# Patient Record
Sex: Male | Born: 1935 | Race: White | Hispanic: No | Marital: Married | Smoking: Former smoker
Health system: Southern US, Community
[De-identification: ages and names within clinical notes are randomized; demographics above are authoritative.]

## PROBLEM LIST (undated history)

## (undated) DIAGNOSIS — I10 Essential (primary) hypertension: Secondary | ICD-10-CM

## (undated) DIAGNOSIS — E1165 Type 2 diabetes mellitus with hyperglycemia: Secondary | ICD-10-CM

## (undated) DIAGNOSIS — E559 Vitamin D deficiency, unspecified: Secondary | ICD-10-CM

## (undated) DIAGNOSIS — E785 Hyperlipidemia, unspecified: Secondary | ICD-10-CM

## (undated) DIAGNOSIS — M1990 Unspecified osteoarthritis, unspecified site: Secondary | ICD-10-CM

## (undated) DIAGNOSIS — I2510 Atherosclerotic heart disease of native coronary artery without angina pectoris: Secondary | ICD-10-CM

## (undated) DIAGNOSIS — G4700 Insomnia, unspecified: Secondary | ICD-10-CM

## (undated) DIAGNOSIS — IMO0001 Reserved for inherently not codable concepts without codable children: Secondary | ICD-10-CM

## (undated) HISTORY — DX: Unspecified osteoarthritis, unspecified site: M19.90

## (undated) HISTORY — PX: OTHER SURGICAL HISTORY: SHX169

## (undated) HISTORY — DX: Essential (primary) hypertension: I10

## (undated) HISTORY — DX: Type 2 diabetes mellitus with hyperglycemia: E11.65

## (undated) HISTORY — DX: Vitamin D deficiency, unspecified: E55.9

## (undated) HISTORY — PX: CORONARY ARTERY BYPASS GRAFT: SHX141

## (undated) HISTORY — DX: Reserved for inherently not codable concepts without codable children: IMO0001

## (undated) HISTORY — DX: Atherosclerotic heart disease of native coronary artery without angina pectoris: I25.10

## (undated) HISTORY — DX: Hyperlipidemia, unspecified: E78.5

## (undated) HISTORY — DX: Insomnia, unspecified: G47.00

## (undated) HISTORY — PX: PTCA: SHX146

---

## 2005-04-15 ENCOUNTER — Inpatient Hospital Stay (HOSPITAL_BASED_OUTPATIENT_CLINIC_OR_DEPARTMENT_OTHER): Admission: RE | Admit: 2005-04-15 | Discharge: 2005-04-15 | Payer: Self-pay | Admitting: Interventional Cardiology

## 2005-04-18 ENCOUNTER — Inpatient Hospital Stay (HOSPITAL_COMMUNITY): Admission: RE | Admit: 2005-04-18 | Discharge: 2005-04-22 | Payer: Self-pay | Admitting: Surgery

## 2005-05-15 ENCOUNTER — Encounter (HOSPITAL_COMMUNITY): Admission: RE | Admit: 2005-05-15 | Discharge: 2005-08-13 | Payer: Self-pay | Admitting: Interventional Cardiology

## 2005-08-14 ENCOUNTER — Encounter (HOSPITAL_COMMUNITY): Admission: RE | Admit: 2005-08-14 | Discharge: 2005-11-12 | Payer: Self-pay | Admitting: Interventional Cardiology

## 2007-04-26 ENCOUNTER — Ambulatory Visit (HOSPITAL_COMMUNITY): Admission: RE | Admit: 2007-04-26 | Discharge: 2007-04-26 | Payer: Self-pay | Admitting: Orthopedic Surgery

## 2007-05-28 ENCOUNTER — Inpatient Hospital Stay (HOSPITAL_COMMUNITY): Admission: RE | Admit: 2007-05-28 | Discharge: 2007-05-30 | Payer: Self-pay | Admitting: Specialist

## 2007-09-15 IMAGING — CR DG LUMBAR SPINE 2-3V
2 series · 2 of 2 positions shown · non-contrast
Comparison: 05/25/07.

CLINICAL DATA: Localization for L3-L5 spinal stenosis.  
LUMBAR SPINE ? CHEST - 2 VIEW:

[view not recorded (1 of 2)]
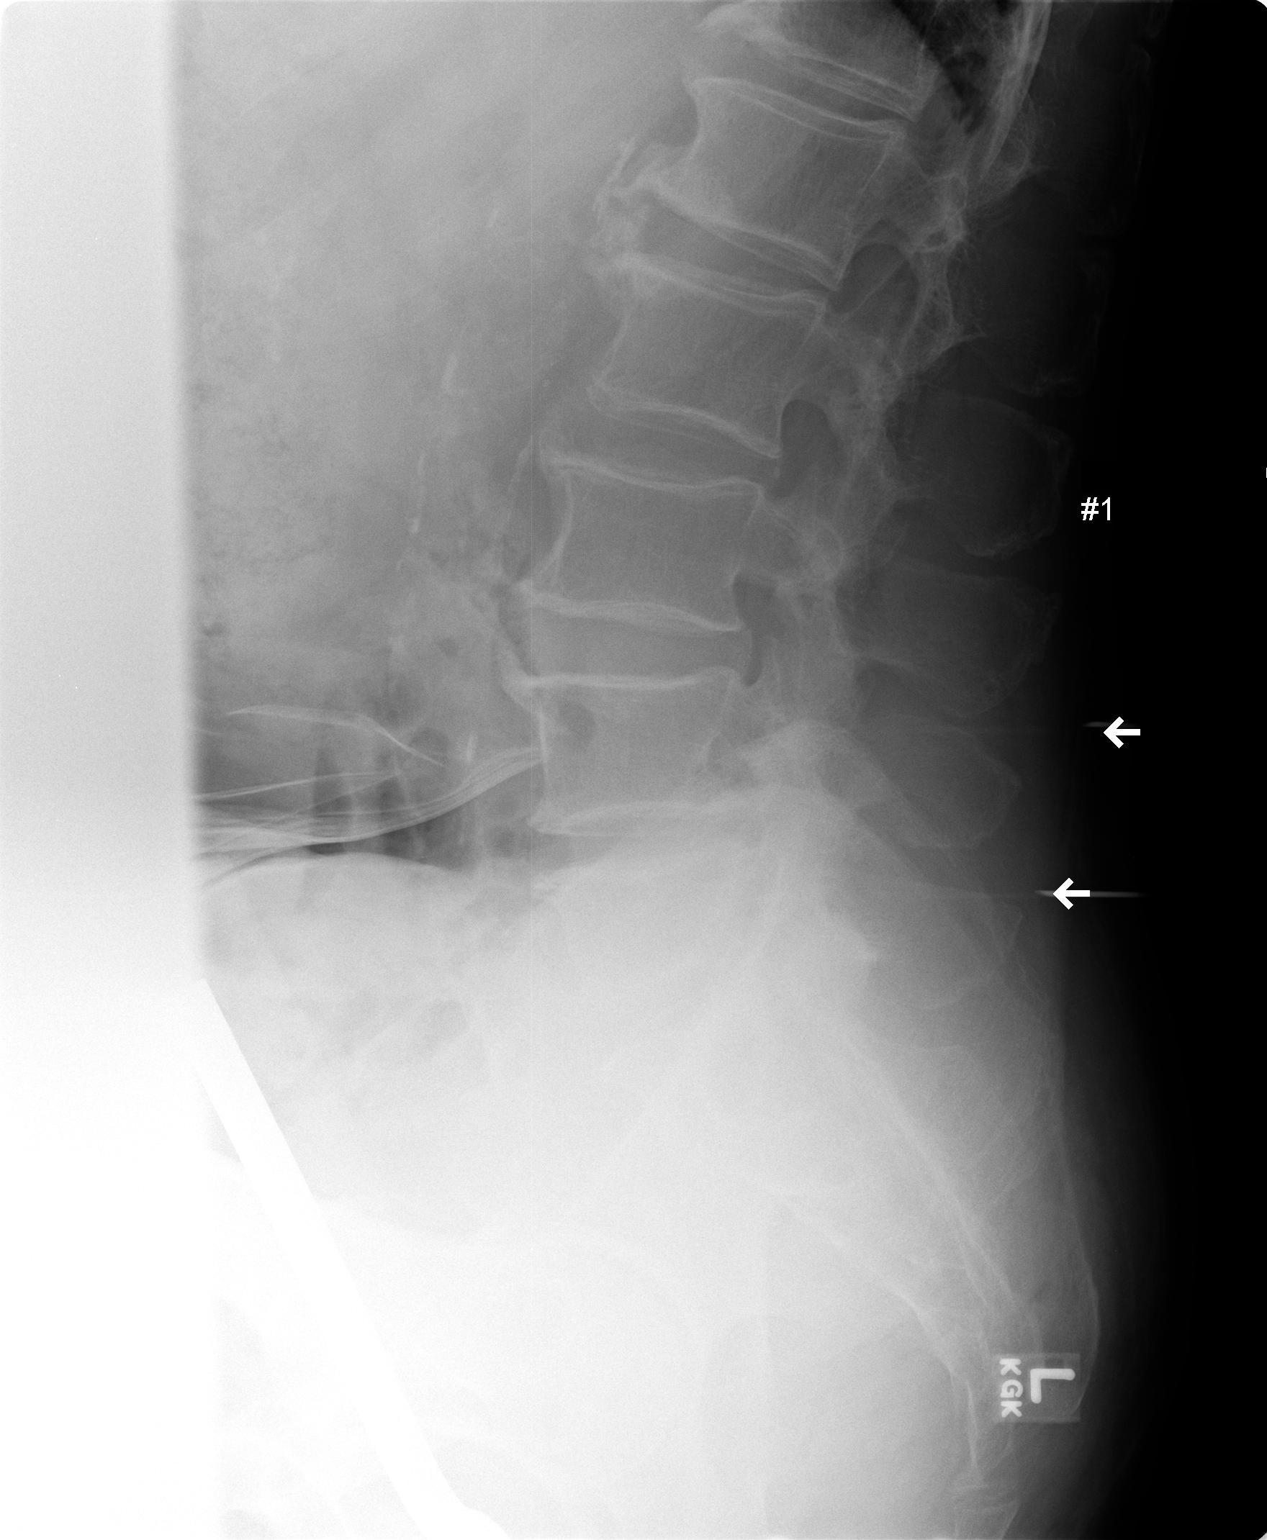

[view not recorded (2 of 2)]
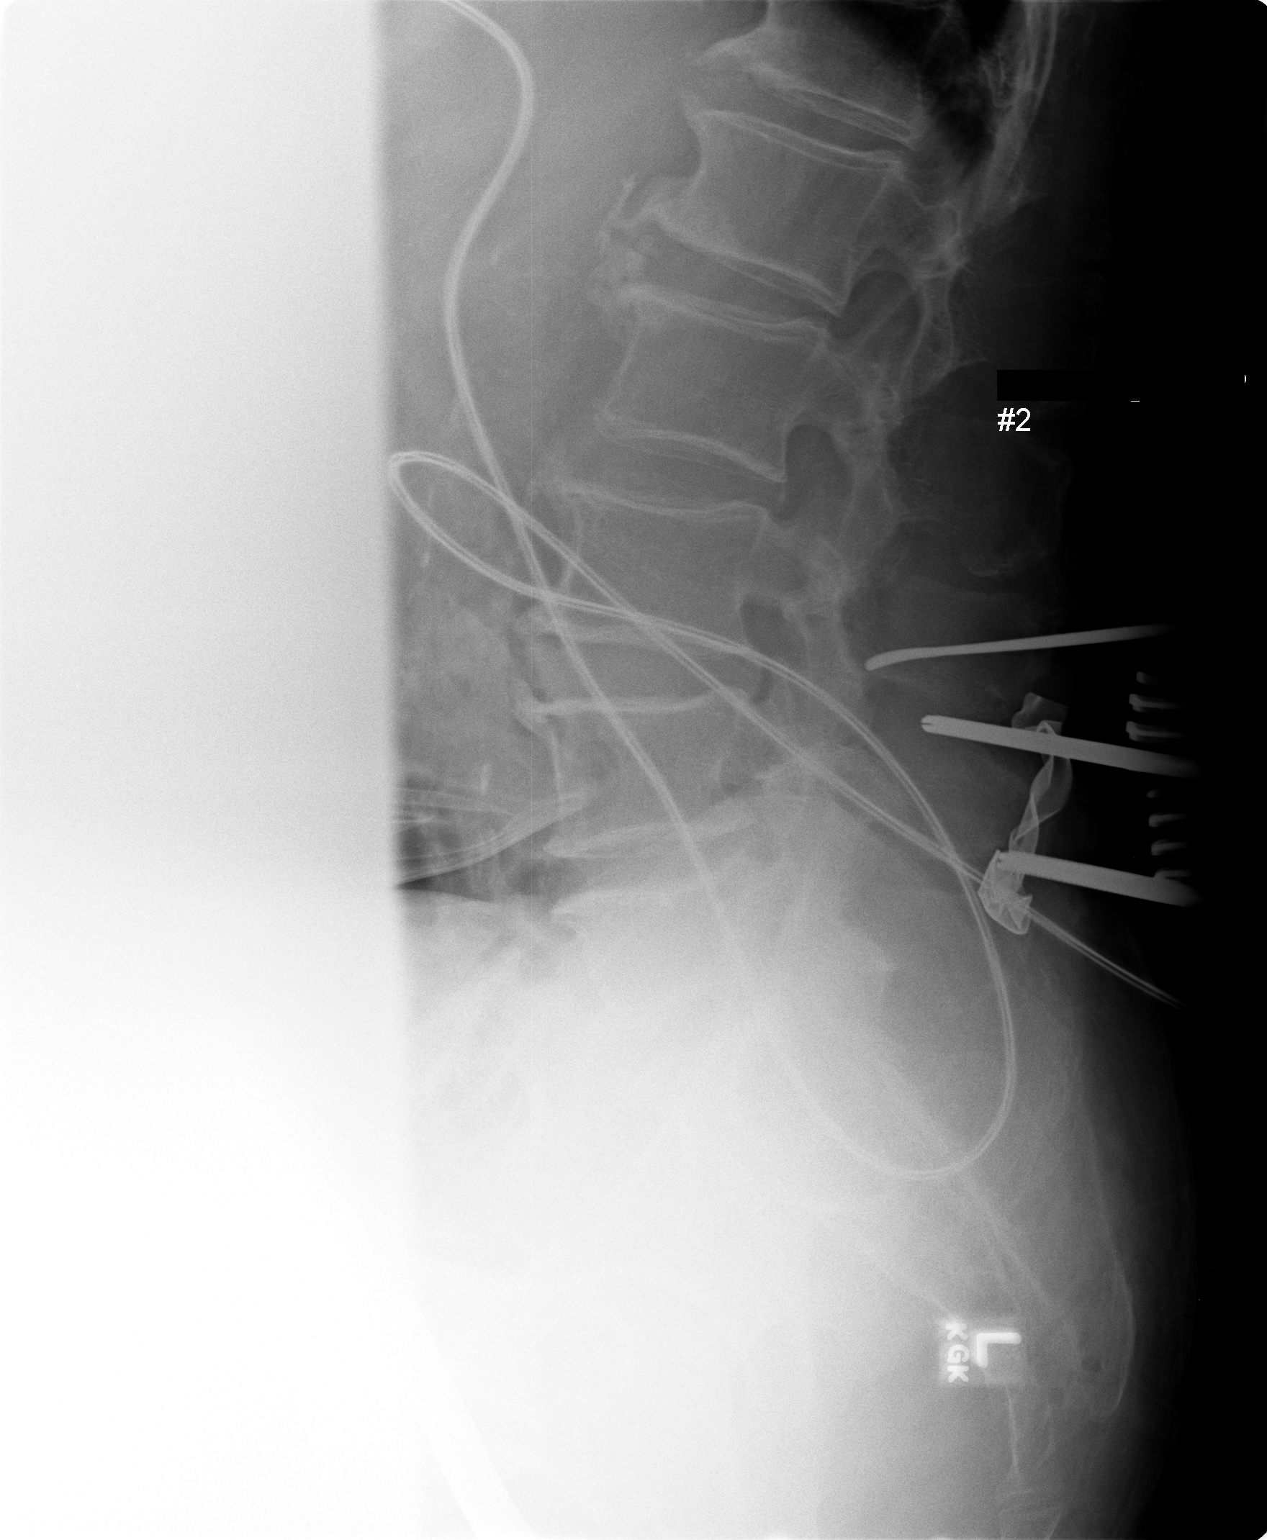

[2 of 2 positions shown; findings below may reference images not displayed]

FINDINGS: Film #1 at 7999 hours.  I am using the same vertebral body numbering that was used on the 2-view lumbar spine series dated 05/25/07. 
There are 2 metallic markers posterior to the spinous processes. One of these is positioned between the 3rd and 4th posterior spinous processes.   The second is between the 4th and 5th lumbar posterior spinous processes.
Film #2 at 3636 hours.  There is now a probe like instrument positioned posterior to the neural canal at the level of the L3-4 disc space.  A 2nd instrument is positioned between the 3rd and 4th posterior spinous processes.  A 3rd instrument is positioned between the 4th and 5th posterior spinous processes.
IMPRESSION: Lumbar localization as above.

## 2011-01-28 NOTE — Op Note (Signed)
NAMEBADEN, BETSCH NO.:  [id number]   MEDICAL RECORD NO.:  [id number]          PATIENT TYPE:  INP   LOCATION:  1609                         FACILITY:  [facility]   PHYSICIAN:  Jene Every, M.D.    DATE OF BIRTH:  May 22, 1935   DATE OF PROCEDURE:  05/27/2007  DATE OF DISCHARGE:                               OPERATIVE REPORT   PREOPERATIVE DIAGNOSIS:  Spinal stenosis L3-L4 and L4-L5.   POSTOPERATIVE DIAGNOSIS:  Spinal stenosis L3-L4 and L4-L5, dural rent.   PROCEDURE PERFORMED:  Central decompression L3-L4 and L4-L5 by  laminectomies of L3 and L4, partial of L5, repair of dural rent.   ANESTHESIA:  General.   SURGEON:  Jene Every, M.D.   ASSISTANT:  Marlowe Kays, M.D.  Roma Schanz, P.A.-C.   BRIEF HISTORY:  This is a 76 year old with neurogenic claudication  secondary to spinal stenosis, severe at L3-L4 and L4-L5.  Operative  intervention was indicated for decompression.  The risks and benefits  have been discussed including bleeding, infection, damage to  neurovascular structures, CSF leakage, epidural fibrosis, adjacent  segment disease, need for fusion in the future, anesthetic  complications, etc.   SURGICAL TECHNIQUE:  The patient was placed in a supine position. After  induction of adequate anesthesia, 1 gram of Kefzol, he was placed prone  on the [place] frame.  All bony prominences were well padded.  The  lumbar region was prepped and draped in the usual sterile fashion.  Two  18 gauge spinal needles were utilized to localize L3-L4 and L4-L5  interspace, confirmed with x-ray.  An incision was made from spinous  process of L3 to below L5.  The subcutaneous tissue was dissected.  Electrocautery was utilized to achieve hemostasis.  The dorsal lumbar  fascia was identified and divided in line with the skin incision.  The  paraspinous muscles were elevated from the lamina of L3-L4 and L4-L5.   A Leksell rongeur was utilized to remove the  spinous processes of L3 and  L4 and partially of L5.  The operating microscope was draped and brought  on the surgical field.  A 2 mm Kerrison was utilized to perform a  decompression of the L4-L5.  We decompressed laterally first at L4-L5  bilaterally.  Hemilaminotomies of the cephalad edge of L5 were  performed, as well, followed by 3 mm Kerrisons.  Next, a 2 mm Kerrison  was utilized to performed hemilaminotomies of the caudad edge of L4  followed by a complete laminectomy of L4 utilizing 2 mm Kerrison.  It  was decompressed laterally first.  The ligamentum flavum was removed  from the central portion.  There was a large osteophytic spur at L4-L5  off to the right that was removed with the Kerrison.  A dural rent was  appreciated, approximately 1.5 cm in length.  This was covered with a  neural patty and proceeded to complete the decompression.  After  removing the complete lamina of L4, we removed the ligamentum flavum  from the interspace of L3-L4, and complete laminectomy of L3 was  performed, as well,  to decompress the lateral recess to the medial  border of the pedicles.  I removed ligamentum flavum from the L3  interspace, as well.  We then decompressed the lateral borders  bilaterally to the medial border of the pedicles performed  foraminotomies of L4 and L5.  Epidural venous plexus were cauterized.  This was performed bilaterally.   After decompressing both lateral recesses and allowing a hockey stick  probe into the foramen of L3, L4, and L5 bilaterally, we then turned our  attention to the dural rent.  We used 4-0 nylon on a non-cutting needle  and placed four single sutures, repairing the dural rent.  The first  stitch was utilized at the end to hold in place some slight elevation of  the dura and a Nicholos Johns was placed to hold the nerve rootlets in place  and to keep them from being incarcerated in the repair.  Four separate  sutures were then placed for a nice watertight  closure.  Following this,  we Valsalva'd the patient and there was no evidence of CSF leakage.  Next we inspected, there was no evidence of CSF leakage or active  bleeding.  Again, excellent decompression was performed.  We obtained  confirmatory radiographs to confirm the levels.   Next, bone wax was placed on the cancellous surfaces.  We copiously  irrigated with antibiotic irrigation.  We then placed Surgicel over the  repair site and then utilized the Tisseel gel in the laminectomy defect,  covering the dural rent.  Next, the wound was copiously irrigated.  We  removed the [facility] retractors.  The paraspinous muscles were  irrigated, there was no evidence of active bleeding.  The dorsal lumbar  fascia was reapproximated with #1 Vicryl interrupted figure-of-eight  sutures, the subcutaneous tissue reapproximated with 2-0 Vicryl simple  sutures, and the skin was reapproximated with staples.  The wound was  dressed sterilely.  The patient was placed supine on the hospital bed,  extubated without difficulty, and transported to the recovery room in  satisfactory condition.   The patient tolerated the procedure well with no complications.  Blood  loss was about 100 mL.  In the recovery room, the patient was  neurovascularly intact without evidence of an orthostatic headache.      Jene Every, M.D.  Electronically Signed     JB/MEDQ  D:  05/28/2007  T:  05/29/2007  Job:  [id number]

## 2011-01-31 NOTE — H&P (Signed)
[name], [name] NO.:  [id number]   MEDICAL RECORD NO.:  [id number]         PATIENT TYPE:  LINP   LOCATION:                               FACILITY:  [facility]   PHYSICIAN:  Jene Every, M.D.    DATE OF BIRTH:  1935-02-05   DATE OF ADMISSION:  05/28/2007  DATE OF DISCHARGE:                              HISTORY & PHYSICAL   CHIEF COMPLAINT:  Lower extremity pain.   HISTORY:  [name] is a 76 year old gentleman with a longstanding  history of neurogenic claudication symptoms.  Pain is worse when he  walks.  He does get relief sitting.  This has been off and on for years.  He underwent conservative treatment without relief.  It is felt he would  benefit from a central decompression.  The risks and benefits of the  surgery were discussed with the patient.  Medical clearance was  obtained.  He does wish to proceed.   PAST MEDICAL HISTORY:  1. Coronary artery disease.  2. Hypertension.  3. Lumbar stenosis.  4. Non-insulin-dependent diabetes.  5. Gastroesophageal reflux disease.   CURRENT MEDICATIONS:  1. Amlodipine.  2. Benazepril 5/20 daily.   1. Atenolol.  2. 5/25 one q.a.m.  3. Darvocet N 100 p.r.n. pain.  4. Metformin 500 mg one p.o. q.a.m.  5. Crestor 10 mg one p.o. q.p.m.  6. Allergy and sinus over the counter p.r.n.  7. Prozac 20 mg p.r.n.  8. Aspirin 81 mg 2 tablets daily.  9. Fish oil 2400 mg two times daily.   ALLERGIES:  None.   SOCIAL HISTORY:  Unknown.   PAST SURGICAL HISTORY:  CABG in 2006; otherwise none known.   PHYSICAL EXAMINATION:  This was taken from  the preop health history  sheet.  VITAL SIGNS:  There are no vital signs.  HEENT:  Atraumatic, normocephalic.  Pupils equal, round, and reactive to  light.  EMs intact.  NECK:  Supple.  No lymphadenopathy.  CHEST:  Clear to auscultation bilaterally.  No rhonchi, wheezes or  rales.  HEART:  Regular rate and rhythm.  No murmurs, gallops or rubs.  ABDOMEN:  Soft, nontender,  nondistended.  Bowel sounds full.  SKIN:  No rashes or lesions.  EXTREMITIES:  The patient has negative straight leg raise bilaterally.  There is pain with extension of lumbar spine.  Calves are soft,  nontender.   IMPRESSION:  Severe stenosis on L3-4 and L4-5.   PLAN:  The patient admitted to [facility]  for central  decompression of the above stated levels.      Roma Schanz, P.A.      Jene Every, M.D.  Electronically Signed    CS/MEDQ  D:  06/17/2007  T:  06/17/2007  Job:  [id number]

## 2011-01-31 NOTE — Discharge Summary (Signed)
[name], [name] NO.:  [id number]   MEDICAL RECORD NO.:  [id number]          PATIENT TYPE:  INP   LOCATION:  1609                         [facility]ACILITY:  [facility]   PHYSICIAN:  Jene Every, M.D.    DATE OF BIRTH:  03-11-1935   DATE OF ADMISSION:  05/27/2007  DATE OF DISCHARGE:  05/30/2007                               DISCHARGE SUMMARY   ADMISSION DIAGNOSES:  1. Severe stenosis, L3-4, L4-5.  2. Coronary artery disease.  3. Hypertension.  4. Noninsulin-dependent diabetes.  5. Gastroesophageal reflux disease.   DISCHARGE DIAGNOSIS:  1. Severe stenosis, L3-4, L4-5, status post central decompression , L3-      4, L4-5, with large dural repair.  2. Coronary artery disease.  3. Hypertension.  4. Noninsulin-dependent diabetes.  5. Gastroesophageal reflux disease.   PROCEDURE:  The patient was taken to the OR on May 27, 2007, to  undergo a central decompression, L3-4 to L4-5.  Surgeon Dr. Tinnie Gens  pain, assistant Roma Schanz, PA-C, as well as Marlowe Kays, MD.  Anesthesia general.  Complications:  The patient did have a large dural  tear that was repaired intraoperatively.   HISTORY:  [name] is a pleasant 76 year old gentleman who has a  longstanding history of neurogenic claudication symptoms.  MRI studies  do show severe stenosis.  The patient has failed conservative treatment  and notes the pain as being disabling.  It is felt at this point that he  would benefit from central decompression.   LABORATORY:  Preoperative CBC showed a hemoglobin 15.9, hematocrit 45.6.  These were followed throughout the hospital course.  Hemoglobin and  hematocrit remained stable.  At time of discharge hemoglobin was 14.0,  hematocrit of 40.2.  He had a slight elevation in his white cell count  postoperatively to as high as 11.3; however, at the time of discharge  this had resolved to 8.4.  Routine chemistries were taken preoperatively  as well as during his  hospital stay, but these were all within normal  range except for a slightly elevated glucose, highest at 165.  At time  of discharge glucose was 107.  He did have a normal renal function.  Preoperative urinalysis was negative.  Preoperative chest x-ray showed  no acute cardiopulmonary abnormalities.  I do not see a preoperative EKG  in the chart.   HOSPITAL COURSE:  The patient was admitted and taken to the OR,  underwent the above-stated procedure.  He was then transferred to the  PACU and then to the orthopedic floor.  Postoperative precautions were  taken secondary to a large dural repair.  The patient was placed flat  for the first 24 hours.  He did fairly well with this.  Pain was  controlled.  He did note a mild headache but this is unrelated to  elevation of the bed.  He denied any nausea, no chest pain or shortness  breath.  He was passing flatus without difficulty.  Vital signs remained  stable as well as laboratory values.  We did slowly on postoperative day  #1 begin to raise  the bed.  The patient did well with this.  PAS were  used for DVT prophylaxis.  Postoperative day #2 the patient continued to  do fairly well.  We did get him out of bed.  He denied any headache or  nausea at that point.  He was doing well with physical therapy.  He did  note improvement from his surgery.  Postoperative day #3 the patient was  doing much better, decreased pain.  He had been out of bed without  difficulty.  He was voiding, good p.o. intake.  Denied any chest pain,  shortness of breath, nausea or headache.  Incision was healing.  There  was no evidence of infection.  It was felt at this point that the  patient could be discharged home.   DISPOSITION:  Patient stable to be discharged to home with home health  PT, OT and any home health needs.   WOUND CARE:  He is to change his dressing daily.  It is okay for him to  shower.   ACTIVITY:  He is to walk with assistance.  Okay for him walk  up and down  stairs utilizing a walker.  No driving for 2-4 weeks.  No sexual  activity for 4 weeks.   He is to follow up with Dr. Shelle Iron in approximately 10 days.  He is to  call if he has any increasing headache or nausea.   DISCHARGE MEDICATIONS:  All home medications as well as:   1. Vitamin C 500 mg daily.  2. Ambien 10 mg q.h.s.  3. Vicodin 5 mg one to two p.o. q.4-6h.  4. Colace 100 mg one p.o. b.i.d.   DIET:  As tolerated.   CONDITION ON DISCHARGE:  Stable.   FINAL DIAGNOSIS:  Doing well status post central decompression and  repair of large dural tear.      Roma Schanz, P.A.      Jene Every, M.D.  Electronically Signed    CS/MEDQ  D:  06/17/2007  T:  06/17/2007  Job:  [id number]

## 2011-01-31 NOTE — Cardiovascular Report (Signed)
NAME:  [name], [name] NO.:  [id number]   MEDICAL RECORD NO.:  [id number]          PATIENT TYPE:  OIB   LOCATION:  6501                         FACILITY:  [facility]   PHYSICIAN:  Lyn Records, M.D.   DATE OF BIRTH:  1935/03/28   DATE OF PROCEDURE:  04/15/2005  DATE OF DISCHARGE:                              CARDIAC CATHETERIZATION   INDICATION FOR STUDY:  [name] is 6 and has a history of prior LAD and  right coronary artery percutaneous intervention greater than 12 years ago  during an acute infarction/acute coronary syndrome.  He is a very stoic  individual who has been relatively asymptomatic.  Recent Cardiolite study  suggested lateral wall ischemia.  Despite absence of symptoms, coronary  angiography is being performed to define the coronary anatomy.   PROCEDURE PERFORMED:  1.  Left heart catheterization.  2.  Selective coronary angiography.  3.  Left ventriculography.   DESCRIPTION:  After informed consent, a 4-French sheath was placed in the  right femoral artery using the modified Seldinger technique.  A 4-French A2  multipurpose catheter was used hemodynamic recordings, left ventriculography  by hand injection, and selective right coronary angiography.  A #4, 4-French  left Judkins catheter was used left coronary angiography.  The patient  tolerated procedure without complication.   RESULTS:  1.  Hemodynamic data:      1.  Aortic pressure was 154/72 mmHg      2.  Left ventricular pressure 147/11 mmHg.  2.  Left ventriculography:  The left ventricle is faintly opacified.      Overall LV function is normal.  The ejection fraction is estimated to be 50%.  No obvious mitral  regurgitation noted.  The Cardiolite study confirmed normal LV function with  a calculated EF of 54%.  1.  Coronary angiography:      1.  Left main coronary:  Heavily calcified.  Distal 70-80% left main          eccentric stenosis.      2.  Left anterior descending coronary:   Ostial 80% narrowing.  Diffuse          disease throughout the proximal, mid and distal LAD.  No high-grade          obstruction is seen.  Heavy calcification is noted.      3.  Circumflex artery:  Proximal 90% stenosis of moderate branching          obtuse marginal is noted.      4.  Right coronary:  Heavy calcification proximal to distal, a lot of          tortuosity, multiple 50% stenoses proximal and mid, distal 80%          stenosis before large PDA and LV branches.   CONCLUSIONS:  1.  Severe three-vessel coronary artery disease, including distal left main      stenosis as outlined above.  2.  Overall normal left ventricular function.  The patient has prior      inferior infarction and also anteroapical infarction.  No  significant      regional wall motion abnormalities are noted.   PLAN:  Coronary artery bypass grafting CVTS consult this week.  Hopefully,  surgery can be done within the next seven days.      Lyn Records, M.D.  Electronically Signed     HWS/MEDQ  D:  04/15/2005  T:  04/15/2005  Job:  [id number]   cc:   Candyce Churn, M.D.  [address] [place]  [place] [address]  Fax: [phone]   CVTS Office

## 2011-01-31 NOTE — Discharge Summary (Signed)
NAMESHENANDOAH, VANDERGRIFF NO.:  [id number]   MEDICAL RECORD NO.:  [id number]          PATIENT TYPE:  INP   LOCATION:  2017                         FACILITY:  [facility]   PHYSICIAN:  Evelene Croon, M.D.     DATE OF BIRTH:  14-Feb-1935   DATE OF ADMISSION:  04/18/2005  DATE OF DISCHARGE:                                 DISCHARGE SUMMARY   ADMISSION DIAGNOSIS:  Coronary artery disease.   PAST MEDICAL HISTORY AND DISCHARGE DIAGNOSES:  1.  Coronary artery disease, status post myocardial infarction, status post      percutaneous transluminal coronary angioplasty of the right coronary      artery and left anterior descending in 1985 by Dr. Clarene Duke, status post      coronary artery bypass grafting x4.  2.  Hypercholesterolemia.  3.  Hypertension.  4.  Type 2 diabetes.  5.  Allergic rhinitis.  6.  Erectile dysfunction.  7.  History of skin cancers.  8.  Status post fracture of the left ankle.  9.  Postoperative anemia, resolved.  10. Postoperative hypokalemia, resolved.   ALLERGIES:  No known drug allergies.   BRIEF HISTORY:  The patient  is a 76 year old Caucasian male with a history  of coronary artery disease, status post  PTCA of the RCA and LAD in 1985 by  Dr. Clarene Duke.  He had a stress test in 2002 that showed no evidence of  ischemia and was scheduled for an exercise treadmill test on March 19, 2005 as  a surveillance to rule out progression of disease.  The patient states that  he had been asymptomatic and denied any chest pain or shortness of breath.  This stress test was indeterminate for ischemia due to development of  ventricular bigeminy with stress.  He had no exercise-induced angina and  failed to achieve a target heart rate.  He subsequently underwent an  adenosine Cardiolite study which showed evidence of prior inferior MI and  lateral ischemia with an ejection fraction of 54%.  The patient then  underwent elective cardiac catheterization on April 15, 2005 which  showed  heavily calcified left main disease and three-vessel coronary artery  disease.  Secondary to this information, the patient was referred to the  CVTS service regarding surgical revascularization.  Dr. Laneta Simmers evaluated the  patient in the office on April 15, 2005 and it was his opinion that the  patient should proceed with elective coronary artery bypass graft surgery.   HOSPITAL COURSE:  The patient was admitted and taken to the OR on April 18, 2005 for coronary artery bypass grafting x4.  The left internal mammary  artery was grafted to the LAD, saphenous vein graft was grafted in sequence  to the intermediate and to the OM, and saphenous vein was grafted to the PD.  Endoscopic vessel harvesting was performed on the left side.  The patient  tolerated the procedure well and was hemodynamically stable immediately  postoperatively.  The patient was transferred from the OR to the SICU in  stable condition.  The patient was extubated without complication  and woke  up from anesthesia neurologically intact.   The patient's postoperative course has progressed as expected.  On  postoperative day 1, he was afebrile and his vital signs are stable.  He was  maintained in normal sinus rhythm.  The patient's only complaint was a mild  soreness.  All invasive lines and chest tubes were discontinued in a routine  manner and all drains were weaned accordingly.  This was done without  difficulty.  The patient has been some volume-overloaded postoperatively and  has been diuresed accordingly.  He will continue to require a short course  of diuresis after discharge.   The patient's diabetes mellitus has been controlled postoperatively with  Lantus insulin initially and then his Metformin was restarted on  postoperative day 2.  The patient has tolerated this transition well and his  CBGs are currently within an acceptable range on this regimen.  The Lantus  insulin was successfully  discontinued.   On postoperative day 2, the patient began cardiac rehab and has tolerated  this quite well.  He is ambulating well with minimal assistance and  continues to improve.  Also on postoperative day 2, the patient was noted to  be anemic.  This has been stable and is resolving.   On postoperative day 3, the patient's only complaint is of mild nausea.  His  bowel function has returned and he is ambulating well.  He is afebrile with  stable vital signs and maintaining normal sinus rhythm.   The patient's only issue at this time is a mild hypertension noted late in  the evenings and the early a.m.  The patient was started on low-dose ACE  inhibitor at this time.  The patient is in stable condition at this time and  as long as he continues to progress in the current manner, he should be  ready for discharge within the next 1-2 days pending morning round  reevaluation.   PHYSICAL EXAMINATION:  CARDIAC:  Regular rate and rhythm.  LUNGS:  The lungs revealed decreased breath sounds in the bases.  ABDOMEN:  The abdomen is benign.  The incisions are clean, dry and intact.  EXTREMITIES:  There is trace edema present in the bilateral lower  extremities.   LABORATORIES:  BMP on April 21, 2005:  Sodium 138, potassium 3.5, BUN 16,  creatinine 0.8, glucose 108.  CBC on April 20, 2005:  White count 9.3,  hemoglobin 10.6, hematocrit 30, platelets 121,000.   CONDITION ON DISCHARGE:  Improved.   INSTRUCTIONS:   MEDICATIONS:  1.  Aspirin 325 mg daily.  2.  Toprol-XL 25 mg daily.  3.  Altace 2.5 mg daily.  4.  Crestor 20 mg daily.  5.  Protonix 40 mg daily.  6.  Lasix 40 mg daily x5 days.  7.  K-Dur 20 mEq daily x5 days.  8.  Glucophage 500 mg daily.  9.  Tylox one to two q.4-6 h. p.r.n. pain.   ACTIVITY:  No driving, no lifting more than 10 pounds and the patient should  continue daily breathing and walking exercises.  DIET:  Low-salt, low-fat, and carbohydrate-modified,  medium-calorie for his  diabetes.   WOUND CARE:  The patient may shower daily and clean his incisions with soap  and water.  If wound problems arise, the patient should contact the CVTS  office at [phone].   FOLLOWUP:  1.  Followup appointment with Dr. Katrinka Blazing; the patient should contact his      office for an  appointment 2 weeks after discharge.  A chest x-ray will      be taken at that time which the patient  will be instructed to bring      with him to the followup appointment with Dr.  Laneta Simmers.  2.  Dr. Laneta Simmers, May 27, 2005 at 12 p.m.  The patient will be      instructed to contact the CVTS if problems arise before the date of that      appointment.      Pecola Leisure, [place]      Evelene Croon, M.D.  Electronically Signed    AY/MEDQ  D:  04/21/2005  T:  04/22/2005  Job:  [id number]   cc:   Lyn Records, M.D.  Fax: [phone]

## 2011-01-31 NOTE — Op Note (Signed)
[name], CRAVER NO.:  [id number]   MEDICAL RECORD NO.:  [id number]          PATIENT TYPE:  INP   LOCATION:  2301                         FACILITY:  [facility]   PHYSICIAN:  Evelene Croon, M.D.     DATE OF BIRTH:  [birth year]   DATE OF PROCEDURE:  04/18/2005  DATE OF DISCHARGE:                                 OPERATIVE REPORT   PREOPERATIVE DIAGNOSIS:  Left main and severe three-vessel coronary artery  disease.   POSTOPERATIVE DIAGNOSIS:  Left main and severe three-vessel coronary artery  disease.   OPERATIVE PROCEDURE:  Median sternotomy, extracorporeal circulation,  coronary artery bypass graft surgery x4 using a left internal mammary artery  graft to the left anterior descending coronary artery, with a sequential  saphenous vein graft to the intermediate coronary artery and the obtuse  marginal branch of the left circumflex coronary artery, and a saphenous vein  graft to the posterior descending coronary artery.  Endoscopic vein  harvesting from the right leg.   ATTENDING SURGEON:  Evelene Croon, M.D.   ASSISTANT:  Salvatore Decent. Cornelius Moras, M.D.   SECOND ASSISTANT:  Coral Ceo, P.A.   ANESTHESIA:  General endotracheal.   CLINICAL HISTORY:  This patient is a 76 year old gentleman with a history of  coronary artery disease, status post PTCA of the right coronary artery and  LAD in 1985.  His last stress test in 2002 showed no evidence of ischemia.  He underwent a surveillance exercise treadmill test in July 2006, although  he has been asymptomatic.  The stress test was indeterminate due to  ventricular bigeminy with stress.  He had no exercise-induced angina and  failed to achieve a target heart rate.  He subsequently had an adenosine  Cardiolite study, which showed evidence of prior inferior MI and lateral  ischemia with an ejection fraction of 54%.  Cardiac catheterization on  April 15, 2005, showed a heavily calcified left main coronary artery with  distal  70-80% left main stenosis.  This area was somewhat hazy.  The LAD had  an 80% ostial narrowing.  The left circumflex had proximal 90% stenosis of a  marginal branch.  The right coronary artery was heavily calcified with  multiple 50% proximal and midstenoses and 80% distal stenosis before a large  posterior descending branch.  Ejection fraction was about 50% with no mitral  regurgitation.  After review of the angiogram and examination of the  patient, it was felt that coronary artery bypass graft surgery was the best  treatment to prevent further ischemia and infarction.  I discussed the  operative procedure with the patient and his wife, including the  alternatives, benefits, and risks, including bleeding, blood transfusion,  infection, stroke, myocardial infarction, graft failure, and death.  He  understood and agreed to proceed.   OPERATIVE PROCEDURE:  The patient was taken to the operating room and placed  on the table in the supine position.  After induction of general  endotracheal anesthesia, a Foley catheter was placed in the bladder using  sterile technique.  Then the chest, abdomen and both lower  extremities were  prepped and draped in the usual sterile manner.  The chest was entered  through a median sternotomy incision and the pericardium opened in the  midline.  Examination of the heart showed good ventricular contractility.  The ascending aorta had no palpable plaques in it.   Then the left internal mammary artery was harvested from the chest wall as a  pedicle graft.  This was a medium-caliber vessel with excellent blood flow  through it.  At the same time a segment of greater saphenous vein was  harvested from the right leg using endoscopic vein harvest technique.  This  vein was of medium size and good quality.   Then the patient was heparinized and when an adequate activated clotting  time was achieved, the distal ascending aorta was cannulated using a 20  French  aortic cannula for arterial inflow.  Venous outflow was achieved  using a two-stage venous cannula through the right atrial appendage.  An  antegrade cardioplegia and vent cannula was inserted in the aortic root.   The patient was placed on cardiopulmonary bypass and the distal coronary  arteries identified.  The LAD was a large, graftable vessel.  The  intermediate vessel was small but graftable.  The obtuse marginal was a  medium-sized, graftable vessel.  The posterior descending was also a medium-  sized, graftable vessel.  It had some diffuse disease in it.   Then the aorta was crossclamped and 500 mL of cold blood antegrade  cardioplegia was administered in the aortic root with quick arrest of the  heart.  Systemic hypothermia to 20 degrees Centigrade and topical  hypothermia with iced saline was used.  A temperature probe was placed in  the septum and an insulating pad in the pericardium.   The first distal anastomosis was performed to the intermediate coronary  artery.  The internal diameter was 1.5 mm.  The conduit used was a segment  of greater saphenous vein and the anastomosis performed in a sequential side-  to-side manner using continuous 7-0 Prolene suture.  Flow was measured  through the graft and was excellent.   The second distal anastomosis was performed to the obtuse marginal branch.  The internal diameter was about 1.75 mm.  The conduit used was the same  segment of greater saphenous vein and the anastomosis performed in a  sequential end-to-side manner using continuous 7-0 Prolene suture.  Flow was  measured through the graft and was excellent.   The third distal anastomosis was performed to the posterior descending  coronary artery.  The internal diameter was 1.6 mm.  The conduit used was a  second segment of greater saphenous vein and the anastomosis performed in an  end-to-side manner using continuous 7-0 Prolene suture.  Flow was measured through the graft and  was excellent.  Then another dose of cardioplegia was  given down the vein grafts and in the aortic root.   The fourth distal anastomosis was performed to the midportion of the left  anterior descending coronary artery.  The internal diameter of this vessel  was about 1.75 mm.  The conduit used was the left internal mammary graft,  and this was brought through an opening in the left pericardium anterior to  the phrenic nerve.  It was anastomosed to the LAD in an end-to-side manner  using continuous 8-0 Prolene suture.  The pedicle was tacked to the  epicardium with 6-0 Prolene sutures.  The patient was rewarmed to 37 degrees  Centigrade.  With the crossclamp in place, the two proximal vein graft  anastomoses were performed to the aortic root in an end-to-side manner using  continuous 6-0 Prolene suture.  Then the clamp was removed from the mammary  pedicle.  There was rapid warming of the ventricular septum and the return  of spontaneous ventricular fibrillation.  The crossclamp was removed with a  time of 81 minutes and the patient spontaneously converted to sinus rhythm.   The proximal and distal anastomoses appeared hemostatic and the alignment of  the grafts satisfactory.  Two temporarily right ventricular and right atrial  pacing wires were placed and brought out through the skin.   When the patient had rewarmed to 37 degrees Centigrade, he was weaned from  cardiopulmonary bypass on no inotropic agents.  Total bypass time was 97  minutes.  Cardiac function appeared excellent with a cardiac output of 5  L/min.  Protamine was given and the venous and aortic cannulas were removed  without difficulty.  Hemostasis was achieved.  Three chest tubes were placed  with a tube in the posterior pericardium, one in the left pleural space, one  in the anterior mediastinum.  The pericardium was reapproximated over the  heart.  The sternum was closed with #6 stainless steel wires.  The fascia  was  closed with continuous #1 Vicryl suture.  The subcutaneous tissue was  closed with a continuous 2-0 Vicryl and the skin with 3-0 Vicryl  subcuticular closure.  The lower extremity vein harvest site was closed in  layers in a similar manner.  The sponge, needle and instrument counts were  correct according to the scrub nurse.  Dry sterile dressings were applied  over the incisions and around the chest tubes, which were hooked to  Pleuravac suction.  The patient remained hemodynamically stable and was  transported to the SICU in guarded but stable condition.      Evelene Croon, M.D.  Electronically Signed     BB/MEDQ  D:  04/18/2005  T:  04/19/2005  Job:  [id number]   cc:   Lyn Records, M.D.  Fax: [phone]   [facility]

## 2011-06-27 LAB — BASIC METABOLIC PANEL
BUN: 12
BUN: 14
CO2: 26
CO2: 29
Calcium: 10.2
Calcium: 9.4
Chloride: 102
Chloride: 102
Chloride: 106
Creatinine, Ser: 0.97
GFR calc non Af Amer: >60
GFR calc non Af Amer: >60
Glucose, Bld: 129 — ABNORMAL HIGH
Glucose, Bld: 165 — ABNORMAL HIGH
Potassium: 3.8
Sodium: 140
Sodium: 144

## 2011-06-27 LAB — URINALYSIS, ROUTINE W REFLEX MICROSCOPIC
Hgb urine dipstick: NEGATIVE
Ketones, ur: NEGATIVE
Protein, ur: NEGATIVE
Specific Gravity, Urine: 1.022
Urobilinogen, UA: 0.2

## 2011-06-27 LAB — CBC
HCT: 41.1
Hemoglobin: 14
Hemoglobin: 14.7
MCHC: 35.3
MCV: 93.5
MCV: 93.8
MCV: 95.2
Platelets: 159
Platelets: 179
RBC: 4.22
RBC: 4.28
RDW: 12.8
WBC: 11 — ABNORMAL HIGH
WBC: 8.4

## 2011-06-27 LAB — TYPE AND SCREEN
ABO/RH(D): O NEG
Antibody Screen: NEGATIVE

## 2011-06-27 LAB — PROTIME-INR: INR: 1

## 2011-06-27 LAB — HEMOGLOBIN AND HEMATOCRIT, BLOOD: Hemoglobin: 15.9

## 2013-07-23 ENCOUNTER — Encounter: Payer: Self-pay | Admitting: Interventional Cardiology

## 2013-07-23 ENCOUNTER — Encounter: Payer: Self-pay | Admitting: *Deleted

## 2013-07-23 DIAGNOSIS — E1365 Other specified diabetes mellitus with hyperglycemia: Secondary | ICD-10-CM

## 2013-07-23 DIAGNOSIS — I25810 Atherosclerosis of coronary artery bypass graft(s) without angina pectoris: Secondary | ICD-10-CM | POA: Insufficient documentation

## 2013-07-23 DIAGNOSIS — E785 Hyperlipidemia, unspecified: Secondary | ICD-10-CM | POA: Insufficient documentation

## 2013-07-23 DIAGNOSIS — G4700 Insomnia, unspecified: Secondary | ICD-10-CM | POA: Insufficient documentation

## 2013-07-23 DIAGNOSIS — E559 Vitamin D deficiency, unspecified: Secondary | ICD-10-CM | POA: Insufficient documentation

## 2013-07-23 DIAGNOSIS — I2510 Atherosclerotic heart disease of native coronary artery without angina pectoris: Secondary | ICD-10-CM | POA: Insufficient documentation

## 2013-07-23 DIAGNOSIS — E1351 Other specified diabetes mellitus with diabetic peripheral angiopathy without gangrene: Secondary | ICD-10-CM | POA: Insufficient documentation

## 2013-07-23 DIAGNOSIS — I10 Essential (primary) hypertension: Secondary | ICD-10-CM | POA: Insufficient documentation

## 2013-07-23 DIAGNOSIS — M1990 Unspecified osteoarthritis, unspecified site: Secondary | ICD-10-CM | POA: Insufficient documentation

## 2013-07-26 ENCOUNTER — Encounter: Payer: Self-pay | Admitting: Interventional Cardiology

## 2013-07-26 ENCOUNTER — Ambulatory Visit (INDEPENDENT_AMBULATORY_CARE_PROVIDER_SITE_OTHER): Payer: Medicare Other | Admitting: Interventional Cardiology

## 2013-07-26 VITALS — BP 102/62 | HR 54 | Ht 69.5 in | Wt 197.1 lb

## 2013-07-26 DIAGNOSIS — I2510 Atherosclerotic heart disease of native coronary artery without angina pectoris: Secondary | ICD-10-CM

## 2013-07-26 DIAGNOSIS — I10 Essential (primary) hypertension: Secondary | ICD-10-CM

## 2013-07-26 DIAGNOSIS — E785 Hyperlipidemia, unspecified: Secondary | ICD-10-CM

## 2013-07-26 NOTE — Patient Instructions (Signed)
Your physician recommends that you continue on your current medications as directed. Please refer to the Current Medication list given to you today.  Your physician wants you to follow-up in: 1 year. You will receive a reminder letter in the mail two months in advance. If you don't receive a letter, please call our office to schedule the follow-up appointment.  

## 2013-07-26 NOTE — Progress Notes (Signed)
Patient ID: [name], male   DOB: Jun 17, 1935, 78 y.o.   MRN: [id number]    [address], [address] [place], [place]  [address] Phone: [phone] Fax:  [phone]  Date:  07/26/2013   ID:  [name], DOB [birth year], MRN [id number]  PCP:  No primary provider on file.   ASSESSMENT:  1. Coronary atherosclerosis, stable without angina 2. Hypertension, controlled 3. Hyperlipidemia, patient is stable on therapy and followed by his primary care physician.   PLAN:  1. Continue medical regimen as listed 2. Continued active lifestyle but with adequate rest commensurate with his age 106. Clinical followup in one year  SUBJECTIVE: [name] is a 78 y.o. male is doing well. He remains active. He notices less energy now than he had several years ago. He denies orthopnea, PND, ankle edema, and syncope. No episodes of prolonged palpitations. No chest discomfort. He did fall off of a scaffold and break his (6 months ago. He is now more careful with climbing.   Wt Readings from Last 3 Encounters:  07/26/13 197 lb 1.9 oz (89.413 kg)     Past Medical History  Diagnosis Date  . Hyperlipidemia   . Coronary atherosclerosis of native coronary artery   . Type II or unspecified type diabetes mellitus without mention of complication, uncontrolled   . HTN (hypertension)   . Insomnia   . Vitamin D deficiency   . Insomnia   . CAD (coronary artery disease)     with LIMA to LAD, SVG to RI, SVG to OM, and SVG to PDA, 2006. LVEF 57% prior to surgery  . DJD (degenerative joint disease)     Current Outpatient Prescriptions  Medication Sig Dispense Refill  . amLODipine-benazepril (LOTREL) 5-20 MG per capsule Take 1 capsule by mouth daily.       Marland Kitchen aspirin 81 MG tablet Take 81 mg by mouth 2 (two) times daily.      Marland Kitchen atenolol-chlorthalidone (TENORETIC) 50-25 MG per tablet Take 1 tablet by mouth daily.       Marland Kitchen HYDROcodone-acetaminophen (NORCO/VICODIN) 5-325 MG per tablet Take 1 tablet by mouth  every 6 (six) hours as needed.       . metFORMIN (GLUCOPHAGE) 500 MG tablet Take 500 mg by mouth daily with breakfast.       . rosuvastatin (CRESTOR) 10 MG tablet Take 10 mg by mouth daily.      Marland Kitchen zolpidem (AMBIEN) 10 MG tablet Take 5 mg by mouth at bedtime.        No current facility-administered medications for this visit.    Allergies:   No Known Allergies  Social History:  The patient  reports that he quit smoking about 34 years ago. His smoking use included Cigarettes. He smoked 0.00 packs per day. He does not have any smokeless tobacco history on file. He reports that he does not drink alcohol or use illicit drugs.   ROS:  Please see the history of present illness.   Denies medication side effects. No transient neurological symptoms. He denies nitroglycerin use   All other systems reviewed and negative.   OBJECTIVE: VS:  BP 102/62  Pulse 54  Ht 5' 9.5" (1.765 m)  Wt 197 lb 1.9 oz (89.413 kg)  BMI 28.70 kg/m2 Well nourished, well developed, in no acute distress, weight loss noted HEENT: normal Neck: JVD flat. Carotid bruit absent  Cardiac:  normal S1, S2; RRR; no murmur Lungs:  clear to auscultation bilaterally, no wheezing,  rhonchi or rales Abd: soft, nontender, no hepatomegaly Ext: Edema absent. Pulses 2+ and symmetric Skin: warm and dry Neuro:  CNs 2-12 intact, no focal abnormalities noted  EKG:  Old inferior infarct, sinus bradycardia. No acute change noted.       Signed, Darci Needle III, MD 07/26/2013 3:39 PM

## 2013-11-19 ENCOUNTER — Encounter: Payer: Self-pay | Admitting: *Deleted

## 2014-07-27 ENCOUNTER — Encounter: Payer: Self-pay | Admitting: Interventional Cardiology

## 2014-07-27 ENCOUNTER — Ambulatory Visit (INDEPENDENT_AMBULATORY_CARE_PROVIDER_SITE_OTHER): Payer: Medicare Other | Admitting: Interventional Cardiology

## 2014-07-27 VITALS — BP 110/64 | HR 60 | Ht 69.0 in | Wt 209.0 lb

## 2014-07-27 DIAGNOSIS — E1365 Other specified diabetes mellitus with hyperglycemia: Secondary | ICD-10-CM

## 2014-07-27 DIAGNOSIS — E785 Hyperlipidemia, unspecified: Secondary | ICD-10-CM

## 2014-07-27 DIAGNOSIS — I10 Essential (primary) hypertension: Secondary | ICD-10-CM

## 2014-07-27 DIAGNOSIS — IMO0002 Reserved for concepts with insufficient information to code with codable children: Secondary | ICD-10-CM

## 2014-07-27 DIAGNOSIS — E1351 Other specified diabetes mellitus with diabetic peripheral angiopathy without gangrene: Secondary | ICD-10-CM

## 2014-07-27 DIAGNOSIS — I25810 Atherosclerosis of coronary artery bypass graft(s) without angina pectoris: Secondary | ICD-10-CM

## 2014-07-27 NOTE — Patient Instructions (Signed)
Your physician recommends that you continue on your current medications as directed. Please refer to the Current Medication list given to you today.  Your physician discussed the importance of regular exercise and recommended that you start or continue a regular exercise program for good health.   Your physician wants you to follow-up in: 1 year You will receive a reminder letter in the mail two months in advance. If you don't receive a letter, please call our office to schedule the follow-up appointment.  

## 2014-07-27 NOTE — Progress Notes (Signed)
Patient ID: [name], male   DOB: 06-04-1935, 79 y.o.   MRN: [id number]    [address], [address] [place], [place]  [address] Phone: [phone] Fax:  [phone]  Date:  07/27/2014   ID:  [name], DOB [birth year], MRN [id number]  PCP:  Coy Saunas, MD   ASSESSMENT:  1. Coronary artery disease with prior bypass surgery, asymptomatic 2. Hyperlipidemia been followed by the primary care physician, Dr. Lorrene [place] 3. Hypertension, controlled and essential 4. Type 2 diabetes, Dictated with vascular disease  PLAN:  1. Aerobic activity to include walking at least 3 times per week up to 20 minutes 2. Notify if chest tightness or burning 3. Clinical follow-up in one year 4. Target LDL 70   SUBJECTIVE: [name] is a 79 y.o. male who is doing well. Has not had palpitations or syncope. He denies lower extremity discomfort that suggests claudication. He has had no transient neurological complaints. No lower extremity edema or orthopnea has been noted. He denies palpitations and syncope.   Wt Readings from Last 3 Encounters:  07/27/14 209 lb (94.802 kg)  07/26/13 197 lb 1.9 oz (89.413 kg)     Past Medical History  Diagnosis Date  . Hyperlipidemia   . Coronary atherosclerosis of native coronary artery   . Type II or unspecified type diabetes mellitus without mention of complication, uncontrolled   . HTN (hypertension)   . Insomnia   . Vitamin D deficiency   . Insomnia   . CAD (coronary artery disease)     with LIMA to LAD, SVG to RI, SVG to OM, and SVG to PDA, 2006. LVEF 57% prior to surgery  . DJD (degenerative joint disease)     Current Outpatient Prescriptions  Medication Sig Dispense Refill  . amLODipine-benazepril (LOTREL) 5-20 MG per capsule Take 1 capsule by mouth daily.     Marland Kitchen aspirin 81 MG tablet Take 81 mg by mouth 2 (two) times daily.    Marland Kitchen atenolol-chlorthalidone (TENORETIC) 50-25 MG per tablet Take 1 tablet by mouth daily.     Marland Kitchen  HYDROcodone-acetaminophen (NORCO/VICODIN) 5-325 MG per tablet Take 1 tablet by mouth every 6 (six) hours as needed.     . metFORMIN (GLUCOPHAGE) 500 MG tablet Take 500 mg by mouth daily with breakfast.     . rosuvastatin (CRESTOR) 10 MG tablet Take 10 mg by mouth daily.    Marland Kitchen zolpidem (AMBIEN) 10 MG tablet Take 5 mg by mouth at bedtime.      No current facility-administered medications for this visit.    Allergies:   No Known Allergies  Social History:  The patient  reports that he quit smoking about 35 years ago. His smoking use included Cigarettes. He smoked 0.00 packs per day. He does not have any smokeless tobacco history on file. He reports that he does not drink alcohol or use illicit drugs.   ROS:  Please see the history of present illness.   Weight and appetite have been stable. No blood in the urine or stool. Denies musculoskeletal discomfort either proportion to age. No abdominal swelling. Denies dysphagia. No wheezing or cough.   All other systems reviewed and negative.   OBJECTIVE: VS:  BP 110/64 mmHg  Pulse 60  Ht 5\' 9"  (1.753 m)  Wt 209 lb (94.802 kg)  BMI 30.85 kg/m2 Well nourished, well developed, in no acute distress, obese HEENT: normal Neck: JVD flat. Carotid bruit absent  Cardiac:  normal S1,  S2; RRR; no murmur Lungs:  clear to auscultation bilaterally, no wheezing, rhonchi or rales Abd: soft, nontender, no hepatomegaly Ext: Edema absent. Pulses 2+ Skin: warm and dry Neuro:  CNs 2-12 intact, no focal abnormalities noted  EKG:  Sinus bradycardia, inferior infarct, old. Poor R-wave progression.       Signed, Illene Labrador III, MD 07/27/2014 12:03 PM

## 2014-08-28 ENCOUNTER — Telehealth: Payer: Self-pay | Admitting: Interventional Cardiology

## 2014-08-28 NOTE — Telephone Encounter (Signed)
New message  Stanton Kidney from [facility] sent a clearance through fax on 12/03 to stop asprin for colonoscopy.  The pt has already stoped the Asprin on his own, so they need clearance to complete the colonscopy and they also ask if is it ok to stay off of the Asprin until the colonoscopy is scheduled. Please call back to discuss//sr

## 2014-08-30 NOTE — Telephone Encounter (Signed)
Returned call to [place] at [facility] was not currently in the office.spoke with Tressia Miners adv her that Dr.Smith has signed pt cardiac clearance rqst to proceed with pt colonoscopy. Pt Aspirin should be held 3-5 days prior to procedure. Cardiac clearance will be faxed over this afternoon.

## 2014-09-01 ENCOUNTER — Telehealth: Payer: Self-pay | Admitting: Interventional Cardiology

## 2014-09-01 NOTE — Telephone Encounter (Signed)
Received request from Nurse fax box, documents faxed for surgical clearance. To: [place] [facility]  Fax number: [phone] Attention: 12.18.15/km

## 2015-09-25 ENCOUNTER — Ambulatory Visit: Payer: Self-pay | Admitting: Interventional Cardiology

## 2015-09-26 ENCOUNTER — Ambulatory Visit (INDEPENDENT_AMBULATORY_CARE_PROVIDER_SITE_OTHER): Payer: Medicare Other | Admitting: Interventional Cardiology

## 2015-09-26 ENCOUNTER — Encounter: Payer: Self-pay | Admitting: Interventional Cardiology

## 2015-09-26 VITALS — BP 136/68 | HR 60 | Ht 69.0 in | Wt 216.0 lb

## 2015-09-26 DIAGNOSIS — C189 Malignant neoplasm of colon, unspecified: Secondary | ICD-10-CM | POA: Insufficient documentation

## 2015-09-26 DIAGNOSIS — E785 Hyperlipidemia, unspecified: Secondary | ICD-10-CM | POA: Diagnosis not present

## 2015-09-26 DIAGNOSIS — I25810 Atherosclerosis of coronary artery bypass graft(s) without angina pectoris: Secondary | ICD-10-CM

## 2015-09-26 DIAGNOSIS — E1351 Other specified diabetes mellitus with diabetic peripheral angiopathy without gangrene: Secondary | ICD-10-CM | POA: Diagnosis not present

## 2015-09-26 DIAGNOSIS — IMO0002 Reserved for concepts with insufficient information to code with codable children: Secondary | ICD-10-CM

## 2015-09-26 DIAGNOSIS — C182 Malignant neoplasm of ascending colon: Secondary | ICD-10-CM

## 2015-09-26 DIAGNOSIS — I10 Essential (primary) hypertension: Secondary | ICD-10-CM

## 2015-09-26 DIAGNOSIS — E1365 Other specified diabetes mellitus with hyperglycemia: Secondary | ICD-10-CM

## 2015-09-26 NOTE — Patient Instructions (Signed)
Medication Instructions:  Your physician recommends that you continue on your current medications as directed. Please refer to the Current Medication list given to you today.   Labwork: None ordered  Testing/Procedures: None orderd  Follow-Up: Your physician wants you to follow-up in: 1 year with Dr.Smith You will receive a reminder letter in the mail two months in advance. If you don't receive a letter, please call our office to schedule the follow-up appointment.   Any Other Special Instructions Will Be Listed Below (If Applicable). Please stay active    If you need a refill on your cardiac medications before your next appointment, please call your pharmacy.

## 2015-09-26 NOTE — Progress Notes (Signed)
Cardiology Office Note   Date:  09/26/2015   ID:  [name], DOB 10/03/1934, MRN [id number]  PCP:  Coy Saunas, MD  Cardiologist:  Sinclair Grooms, MD   Chief Complaint  Patient presents with  . Coronary Artery Disease      History of Present Illness: [name] is a 81 y.o. male who presents for coronary artery disease, hypertension, hyperlipidemia, prior coronary bypass grafting, and history of colon cancer.  Patient is diabetic. Colon cancer resection one year ago and has gone well. He is having some diarrhea. No cardiac complications during surgery or recovery. He is not as active now as before. He denies chest discomfort and excessive dyspnea. No orthopnea PND. There are no symptoms that suggest claudication.    Past Medical History  Diagnosis Date  . Hyperlipidemia   . Coronary atherosclerosis of native coronary artery   . Type II or unspecified type diabetes mellitus without mention of complication, uncontrolled   . HTN (hypertension)   . Insomnia   . Vitamin D deficiency   . Insomnia   . CAD (coronary artery disease)     with LIMA to LAD, SVG to RI, SVG to OM, and SVG to PDA, 2006. LVEF 57% prior to surgery  . DJD (degenerative joint disease)     Past Surgical History  Procedure Laterality Date  . Coronary artery bypass graft    . Ptca       November, 1995, Dr. Aldona Bar  . Lumbar laminectomy, l3-5, september, 2008, dr. Dellis Filbert beane    . Left wrist surgery - for fx - dr. Apolonio Schneiders       Current Outpatient Prescriptions  Medication Sig Dispense Refill  . amLODipine-benazepril (LOTREL) 5-20 MG per capsule Take 1 capsule by mouth daily.     Marland Kitchen aspirin 81 MG tablet Take 81 mg by mouth 2 (two) times daily.    Marland Kitchen atenolol-chlorthalidone (TENORETIC) 50-25 MG per tablet Take 1 tablet by mouth daily.     Marland Kitchen HYDROcodone-acetaminophen (NORCO/VICODIN) 5-325 MG per tablet Take 1 tablet by mouth every 6 (six) hours as needed.     . metFORMIN (GLUCOPHAGE) 500 MG  tablet Take 500 mg by mouth daily with breakfast.     . rosuvastatin (CRESTOR) 10 MG tablet Take 10 mg by mouth daily.    Marland Kitchen zolpidem (AMBIEN) 10 MG tablet Take 5 mg by mouth at bedtime.      No current facility-administered medications for this visit.    Allergies:   Review of patient's allergies indicates no known allergies.    Social History:  The patient  reports that he quit smoking about 37 years ago. His smoking use included Cigarettes. He does not have any smokeless tobacco history on file. He reports that he does not drink alcohol or use illicit drugs.   Family History:  The patient's family history includes Cancer in his father; Hypertension in his sister.    ROS:  Please see the history of present illness.   Otherwise, review of systems are positive for some muscle aches and pains. Diarrhea as noted above. Low back discomfort. No evidence of metastasis post surgery..   All other systems are reviewed and negative.    PHYSICAL EXAM: VS:  BP 136/68 mmHg  Pulse 60  Ht 5\' 9"  (1.753 m)  Wt 216 lb (97.977 kg)  BMI 31.88 kg/m2 , BMI Body mass index is 31.88 kg/(m^2). GEN: Well nourished, well developed, in no acute distress  HEENT: normal Neck: no JVD, carotid bruits, or masses Cardiac: RRR.  There is no murmur, rub, or gallop. There is no edema. Respiratory:  clear to auscultation bilaterally, normal work of breathing. GI: soft, nontender, nondistended, + BS MS: no deformity or atrophy Skin: warm and dry, no rash Neuro:  Strength and sensation are intact Psych: euthymic mood, full affect   EKG:  EKG is ordered today. The ekg reveals sinus rhythm at 63 bpm with inferior infarct and first-degree AV block. No change from prior tracing.   Recent Labs: No results found for requested labs within last 365 days.    Lipid Panel No results found for: CHOL, TRIG, HDL, CHOLHDL, VLDL, LDLCALC, LDLDIRECT    Wt Readings from Last 3 Encounters:  09/26/15 216 lb (97.977 kg)    07/27/14 209 lb (94.802 kg)  07/26/13 197 lb 1.9 oz (89.413 kg)      Other studies Reviewed: Additional studies/ records that were reviewed today include: Electronic health record. Colon resection was done at [facility]. The findings include no recent laboratory data done in the [facility] health network. Labs from 2016 revealed creatinine of 0.97 with BUN of 19 potassium 3.9, total cholesterol 132 HDL 37 LDL 47.    ASSESSMENT AND PLAN:  1. Coronary artery disease involving coronary bypass graft of native heart without angina pectoris No angina or ischemic symptoms  2. Essential hypertension Controlled  3. Hyperlipidemia Lipids are excellent based upon September 2016 data and care everywhere  4. DM (diabetes mellitus), secondary, uncontrolled, with peripheral vascular complications ([place]) Followed by primary care with last A1c of 5.9  5. Malignant neoplasm of ascending colon [facility]) Apparent successful laparoscopic ascending colectomy in January 2016.    Current medicines are reviewed at length with the patient today.  The patient has the following concerns regarding medicines: None.  The following changes/actions have been instituted:    Continue medications as listed  No cardiac evaluation is necessary  Labs/ tests ordered today include:  No orders of the defined types were placed in this encounter.     Disposition:   FU with HS in 1 year  Signed, Sinclair Grooms, MD  09/26/2015 9:20 AM    [address], [place], [place]  [address] Phone: [phone]; Fax: [phone]

## 2017-06-01 ENCOUNTER — Ambulatory Visit (INDEPENDENT_AMBULATORY_CARE_PROVIDER_SITE_OTHER): Payer: Medicare Other | Admitting: Interventional Cardiology

## 2017-06-01 ENCOUNTER — Encounter: Payer: Self-pay | Admitting: Interventional Cardiology

## 2017-06-01 VITALS — BP 114/64 | HR 61 | Ht 69.0 in | Wt 212.4 lb

## 2017-06-01 DIAGNOSIS — IMO0002 Reserved for concepts with insufficient information to code with codable children: Secondary | ICD-10-CM

## 2017-06-01 DIAGNOSIS — I25810 Atherosclerosis of coronary artery bypass graft(s) without angina pectoris: Secondary | ICD-10-CM

## 2017-06-01 DIAGNOSIS — E1365 Other specified diabetes mellitus with hyperglycemia: Secondary | ICD-10-CM

## 2017-06-01 DIAGNOSIS — E784 Other hyperlipidemia: Secondary | ICD-10-CM | POA: Diagnosis not present

## 2017-06-01 DIAGNOSIS — E7849 Other hyperlipidemia: Secondary | ICD-10-CM

## 2017-06-01 DIAGNOSIS — E1351 Other specified diabetes mellitus with diabetic peripheral angiopathy without gangrene: Secondary | ICD-10-CM

## 2017-06-01 DIAGNOSIS — I10 Essential (primary) hypertension: Secondary | ICD-10-CM | POA: Diagnosis not present

## 2017-06-01 NOTE — Patient Instructions (Signed)

## 2017-06-01 NOTE — Progress Notes (Signed)
Cardiology Office Note    Date:  06/01/2017   ID:  [name], DOB September 21, 1934, MRN [id number]  PCP:  Coy Saunas, MD  Cardiologist: Sinclair Grooms, MD   Chief Complaint  Patient presents with  . Coronary Artery Disease    History of Present Illness:  [name] is a 82 y.o. male who presents for coronary artery disease, hypertension, hyperlipidemia, prior coronary bypass grafting, and history of colon cancer.  He is doing well. He had his first grandchild this year. Previous 3 months old. Physically, he is slowing down somewhat. He denies angina. No exertional dyspnea or orthopnea. No peripheral edema. No palpitations or syncope. He denies claudication. No medication side effects.   Past Medical History:  Diagnosis Date  . CAD (coronary artery disease)    with LIMA to LAD, SVG to RI, SVG to OM, and SVG to PDA, 2006. LVEF 57% prior to surgery  . Coronary atherosclerosis of native coronary artery   . DJD (degenerative joint disease)   . HTN (hypertension)   . Hyperlipidemia   . Insomnia   . Insomnia   . Type II or unspecified type diabetes mellitus without mention of complication, uncontrolled   . Vitamin D deficiency     Past Surgical History:  Procedure Laterality Date  . CORONARY ARTERY BYPASS GRAFT    . Left wrist surgery - for fx - Dr. Apolonio Schneiders    . lumbar laminectomy, L3-5, September, 2008, Dr. Susa Day    . PTCA      November, 1995, Dr. Aldona Bar    Current Medications: Outpatient Medications Prior to Visit  Medication Sig Dispense Refill  . amLODipine-benazepril (LOTREL) 5-20 MG per capsule Take 1 capsule by mouth daily.     Marland Kitchen aspirin 81 MG tablet Take 162 mg by mouth daily.     Marland Kitchen atenolol-chlorthalidone (TENORETIC) 50-25 MG per tablet Take 1 tablet by mouth daily.     Marland Kitchen HYDROcodone-acetaminophen (NORCO/VICODIN) 5-325 MG per tablet Take 1 tablet by mouth every 6 (six) hours as needed.     . metFORMIN (GLUCOPHAGE) 500 MG tablet Take 500 mg by  mouth daily with breakfast.     . rosuvastatin (CRESTOR) 10 MG tablet Take 10 mg by mouth daily.    Marland Kitchen zolpidem (AMBIEN) 10 MG tablet Take 5 mg by mouth at bedtime.      No facility-administered medications prior to visit.      Allergies:   Patient has no known allergies.   Social History   Social History  . Marital status: Married    Spouse name: N/A  . Number of children: N/A  . Years of education: N/A   Social History Main Topics  . Smoking status: Former Smoker    Types: Cigarettes    Quit date: 09/15/1978  . Smokeless tobacco: Never Used  . Alcohol use No  . Drug use: No  . Sexual activity: Not on file   Other Topics Concern  . Not on file   Social History Narrative  . No narrative on file     Family History:  The patient's family history includes Cancer in his father; Hypertension in his sister.   ROS:   Please see the history of present illness.    Lower back discomfort, intestinal complaints including diarrhea and constipation following bowel resection for colon cancer.  All other systems reviewed and are negative.   PHYSICAL EXAM:   VS:  BP 114/64 (BP Location: Left Arm)  Pulse 61   Ht 5\' 9"  (1.753 m)   Wt 212 lb 6.4 oz (96.3 kg)   BMI 31.37 kg/m    GEN: Well nourished, well developed, in no acute distress . Moderate obesity. HEENT: normal  Neck: no JVD, carotid bruits, or masses Cardiac: RRR; no murmurs, rubs, or gallops,no edema  Respiratory:  clear to auscultation bilaterally, normal work of breathing GI: soft, nontender, nondistended, + BS MS: no deformity or atrophy  Skin: warm and dry, no rash Neuro:  Alert and Oriented x 3, Strength and sensation are intact Psych: euthymic mood, full affect  Wt Readings from Last 3 Encounters:  06/01/17 212 lb 6.4 oz (96.3 kg)  09/26/15 216 lb (98 kg)  07/27/14 209 lb (94.8 kg)      Studies/Labs Reviewed:   EKG:  EKG  Sinus rhythm, old inferior infarct, hemorrhage compared to prior tracings, no  significant changes occurred. Also of note is mild first-degree AV block.  Recent Labs: No results found for requested labs within last 8760 hours.   Lipid Panel No results found for: CHOL, TRIG, HDL, CHOLHDL, VLDL, LDLCALC, LDLDIRECT  Additional studies/ records that were reviewed today include:  No new data    ASSESSMENT:    1. Coronary artery disease involving coronary bypass graft of native heart without angina pectoris   2. Essential hypertension   3. DM (diabetes mellitus), secondary, uncontrolled, with peripheral vascular complications ([place])   4. Other hyperlipidemia      PLAN:  In order of problems listed above:  1. Stable with out angina. I encouraged aerobic activity. I encouraged him to report any tightness in the chest or excessive dyspnea. No functional testing indicated at this time. 2. Nights blood pressure control currently. No change in the medical regimen is noted. 3. Not discussed. 4. Last LDL was less than 70. I reviewed the data on care in [place] where from his primary physician, Dr. Alean Rinne. Continue same therapy.  I encouraged aerobic activity, weight loss, and to notify for any chest discomfort or dyspnea. Clinical follow-up in one year.    Medication Adjustments/Labs and Tests Ordered: Current medicines are reviewed at length with the patient today.  Concerns regarding medicines are outlined above.  Medication changes, Labs and Tests ordered today are listed in the Patient Instructions below. Patient Instructions  Medication Instructions:  None  Labwork: None  Testing/Procedures: None  Follow-Up: Your physician wants you to follow-up in: 1 year with Dr. Tamala Julian.  You will receive a reminder letter in the mail two months in advance. If you don't receive a letter, please call our office to schedule the follow-up appointment.   Any Other Special Instructions Will Be Listed Below (If Applicable).     If you need a refill on your cardiac  medications before your next appointment, please call your pharmacy.      Signed, Sinclair Grooms, MD  06/01/2017 5:00 PM    [address], [place], [place]  [id number] Phone: [phone]; Fax: [phone]

## 2018-07-16 ENCOUNTER — Encounter

## 2019-04-16 DEATH — deceased
